# Patient Record
Sex: Female | Born: 2005 | Race: White | Hispanic: No | Marital: Single | Smoking: Never smoker
Health system: Southern US, Community
[De-identification: ages and names within clinical notes are randomized; demographics above are authoritative.]

## PROBLEM LIST (undated history)

## (undated) DIAGNOSIS — F845 Asperger's syndrome: Secondary | ICD-10-CM

## (undated) HISTORY — PX: WISDOM TOOTH EXTRACTION: SHX21

## (undated) HISTORY — PX: TONSILLECTOMY: SUR1361

---
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
## 2024-06-15 ENCOUNTER — Ambulatory Visit: Payer: Self-pay | Admitting: Family Medicine

## 2024-06-15 ENCOUNTER — Encounter: Payer: Self-pay | Admitting: Emergency Medicine

## 2024-06-15 ENCOUNTER — Ambulatory Visit
Admission: EM | Admit: 2024-06-15 | Discharge: 2024-06-15 | Disposition: A | Discharge status: Home / Self Care | Payer: Self-pay | Attending: Family Medicine | Admitting: Family Medicine

## 2024-06-15 DIAGNOSIS — J069 Acute upper respiratory infection, unspecified: Secondary | ICD-10-CM | POA: Insufficient documentation

## 2024-06-15 DIAGNOSIS — R1012 Left upper quadrant pain: Secondary | ICD-10-CM | POA: Insufficient documentation

## 2024-06-15 LAB — MONONUCLEOSIS SCREEN: Mono Screen: NEGATIVE

## 2024-06-15 LAB — URINALYSIS, W/ REFLEX TO CULTURE (INFECTION SUSPECTED)
Bilirubin Urine: NEGATIVE
Glucose, UA: NEGATIVE mg/dL
Hgb urine dipstick: NEGATIVE
Ketones, ur: NEGATIVE mg/dL
Leukocytes,Ua: NEGATIVE
Nitrite: NEGATIVE
Protein, ur: NEGATIVE mg/dL
RBC / HPF: NONE SEEN RBC/hpf (ref 0–5)
Specific Gravity, Urine: 1.015 (ref 1.005–1.030)
WBC, UA: NONE SEEN WBC/hpf (ref 0–5)
pH: 7 (ref 5.0–8.0)

## 2024-06-15 LAB — GROUP A STREP BY PCR: Group A Strep by PCR: NOT DETECTED

## 2024-06-15 LAB — RESP PANEL BY RT-PCR (FLU A&B, COVID) ARPGX2
Influenza A by PCR: NEGATIVE
Influenza B by PCR: NEGATIVE
SARS Coronavirus 2 by RT PCR: NEGATIVE

## 2024-06-15 MED ORDER — PROMETHAZINE-DM 6.25-15 MG/5ML PO SYRP: 5.0000 mL | ORAL_SOLUTION | Freq: Four times a day (QID) | ORAL | 0 refills | Status: AC | PRN

## 2024-06-15 MED ORDER — IPRATROPIUM BROMIDE 0.06 % NA SOLN: 2.0000 | Freq: Four times a day (QID) | NASAL | 12 refills | Status: AC

## 2024-06-15 NOTE — ED Triage Notes (Signed)
 Patient c/o sore throat and bodyaches that started yesterday.  Patient also reports left sided lower abdominal pain and dysuria that started 3 days ago.  Patient denies vaginal discharge.  Patient unsure of fevers.

## 2024-06-15 NOTE — ED Provider Notes (Signed)
 [facility]    CSN: [id number] Arrival date & time: 06/15/24  1316      History   Chief Complaint Chief Complaint  Patient presents with   Abdominal Pain   Dysuria   Sore Throat    HPI Belvia A Amster is a 18 y.o. female.   HPI  History obtained from the patient. Juan presents for sore throat, cough, nasal congestion and low grade fevers that started yesterday.  Ear pain this morning. Has bilateral watery ear discharge with sharp pain.  Put some ear drops in her ears. She has not been swimming recently.  Denies vomiting and diarrhea. She has left sided abdominal pain that started 3 days ago. No new back pain. Says she is achy all over.  Denies dysuria, vaginal discharge, hematuria, urinary frequency. Requests urine testing as she believes she may have a urinary tract infection. No concern for STI. She states she has severe anxiety.       Past Medical History:  Diagnosis Date   Asperger's disorder     There are no active problems to display for this patient.   Past Surgical History:  Procedure Laterality Date   TONSILLECTOMY     WISDOM TOOTH EXTRACTION      OB History   No obstetric history on file.      Home Medications    Prior to Admission medications   Medication Sig Start Date End Date Taking? Authorizing Provider  ipratropium (ATROVENT ) 0.06 % nasal spray Place 2 sprays into both nostrils 4 (four) times daily. 06/15/24  Yes Mashell Sieben, DO  promethazine -dextromethorphan (PROMETHAZINE -DM) 6.25-15 MG/5ML syrup Take 5 mLs by mouth 4 (four) times daily as needed. 06/15/24  Yes Janna Oak, DO  propranolol (INDERAL) 10 MG tablet Take 10 mg by mouth. 03/01/24  Yes [provider]  sertraline (ZOLOFT) 100 MG tablet Take 100 mg by mouth. 03/31/24  Yes [provider]    Family History History reviewed. No pertinent family history.  Social History Social History   Tobacco Use   Smoking status: Never   Smokeless  tobacco: Never  Vaping Use   Vaping status: Former  Substance Use Topics   Alcohol use: No   Drug use: No     Allergies   Patient has no known allergies.   Review of Systems Review of Systems: negative unless otherwise stated in HPI.      Physical Exam Triage Vital Signs ED Triage Vitals  Encounter Vitals Group     BP 06/15/24 1330 125/84     Girls Systolic BP Percentile --      Girls Diastolic BP Percentile --      Boys Systolic BP Percentile --      Boys Diastolic BP Percentile --      Pulse Rate 06/15/24 1330 90     Resp 06/15/24 1330 14     Temp 06/15/24 1330 99.1 F (37.3 C)     Temp Source 06/15/24 1330 Oral     SpO2 06/15/24 1330 97 %     Weight 06/15/24 1328 135 lb (61.2 kg)     Height 06/15/24 1328 5' 3.5 (1.613 m)     Head Circumference --      Peak Flow --      Pain Score 06/15/24 1328 8     Pain Loc --      Pain Education --      Exclude from Growth Chart --    No data found.  Updated  Vital Signs BP 125/84 (BP Location: Right Arm)   Pulse 90   Temp 99.1 F (37.3 C) (Oral)   Resp 14   Ht 5' 3.5 (1.613 m)   Wt 61.2 kg   LMP 05/31/2024 (Exact Date)   SpO2 97%   BMI 23.54 kg/m   Visual Acuity Right Eye Distance:   Left Eye Distance:   Bilateral Distance:    Right Eye Near:   Left Eye Near:    Bilateral Near:     Physical Exam GEN:     alert, non-toxic appearing female in no distress    HENT:  mucus membranes moist, oropharyngeal without lesions, mild erythema, no tonsillar hypertrophy or exudates,  moderate erythematous edematous turbinates, clear nasal discharge, bilateral TM normal EYES:   pupils equal and reactive, no scleral injection or discharge NECK:  normal ROM, + bilateral anterior and posterior lymphadenopathy, no meningismus   RESP:  no increased work of breathing, clear to auscultation bilaterally CVS:   regular rate and rhythm ABD:   Soft, nontender, nondistended, no guarding, no rebound, active bowel sounds throughout,  negative McBurney's, negative Murphy's Skin:   warm and dry, no rash on visible skin    UC Treatments / Results  Labs (all labs ordered are listed, but only abnormal results are displayed) Labs Reviewed  URINALYSIS, W/ REFLEX TO CULTURE (INFECTION SUSPECTED) - Abnormal; Notable for the following components:      Result Value   Bacteria, UA RARE (*)    All other components within normal limits  GROUP A STREP BY PCR  RESP PANEL BY RT-PCR (FLU A&B, COVID) [id number]  MONONUCLEOSIS SCREEN    EKG   Radiology No results found.   Procedures Procedures (including critical care time)  Medications Ordered in UC Medications - No data to display  Initial Impression / Assessment and Plan / UC Course  I have reviewed the triage vital signs and the nursing notes.  Pertinent labs & imaging results that were available during my care of the patient were reviewed by me and considered in my medical decision making (see chart for details).       Pt is a 18 y.o. female who presents for 1 days of respiratory symptoms. Levana is afebrile here without recent antipyretics. Satting well on room air. Overall pt is non-toxic appearing, well hydrated, without respiratory distress. Pulmonary exam is unremarkable.  COVID and influenza panel obtained and was negative. Strep PCR is negative.  She has no evidence of otitis media or externa. There was no ceruminosis or effusion either. Mononucleosis test offered and obtained. I will call patient if result is positive.   Suspect acute viral respiratory illness. Discussed symptomatic treatment.  Explained lack of efficacy of antibiotics in viral disease.  Typical duration of symptoms discussed.  Atrovent  nasal spray for nasal congestion and promethazine  DM for cough.    Regarding her abdominal pain, abdominal exam is normal.  Urinalysis is unremarkable.    Return and ED precautions given and voiced understanding. Discussed MDM, treatment plan and plan for  follow-up with patient who agrees with plan.    Mononucleosis screen is negative.    Final Clinical Impressions(s) / UC Diagnoses   Final diagnoses:  Viral URI with cough  Left upper quadrant abdominal pain     Discharge Instructions      You don't have a urinary tract infection. Your strep, influenza and COVID are negative. I will call you if your mono test is positive. You will see your  results in your MyChart (if you have an active [facility] MyChart).  If negative, you have a viral respiratory infection that will gradually improve over the next 7-10 days. Cough may last up to 3 weeks.    You can take Tylenol and/or Ibuprofen as needed for fever reduction and pain relief.    For cough: honey 1/2 to 1 teaspoon (you can dilute the honey in water or another fluid).  Stop at the pharmacy to pick up your prescription cough medication. You can use a humidifier for chest congestion and cough.  If you don't have a humidifier, you can sit in the bathroom with the hot shower running.      For sore throat: try warm salt water gargles, Mucinex sore throat cough drops or cepacol lozenges, throat spray, warm tea or water with lemon/honey, popsicles or ice, or OTC cold relief medicine for throat discomfort. You can also purchase chloraseptic spray at the pharmacy or dollar store.    For congestion: take a daily anti-histamine like Zyrtec, Claritin, and pick up the prescription nasal spray from the pharmacy.    It is important to stay hydrated: drink plenty of fluids (water, gatorade/powerade/pedialyte, juices, or teas) to keep your throat moisturized and help further relieve irritation/discomfort.    Return or go to the Emergency Department if symptoms worsen or do not improve in the next few days      ED Prescriptions     Medication Sig Dispense Auth. Provider   ipratropium (ATROVENT ) 0.06 % nasal spray Place 2 sprays into both nostrils 4 (four) times daily. 15 mL Johnnisha Forton, DO    promethazine -dextromethorphan (PROMETHAZINE -DM) 6.25-15 MG/5ML syrup Take 5 mLs by mouth 4 (four) times daily as needed. 118 mL Masyn Fullam, DO      PDMP not reviewed this encounter.   Yigit Norkus, DO 06/15/24 1448

## 2024-06-15 NOTE — Discharge Instructions (Addendum)
 You don't have a urinary tract infection. Your strep, influenza and COVID are negative. I will call you if your mono test is positive. You will see your results in your MyChart (if you have an active [place] MyChart).  If negative, you have a viral respiratory infection that will gradually improve over the next 7-10 days. Cough may last up to 3 weeks.    You can take Tylenol and/or Ibuprofen as needed for fever reduction and pain relief.    For cough: honey 1/2 to 1 teaspoon (you can dilute the honey in water or another fluid).  Stop at the pharmacy to pick up your prescription cough medication. You can use a humidifier for chest congestion and cough.  If you don't have a humidifier, you can sit in the bathroom with the hot shower running.      For sore throat: try warm salt water gargles, Mucinex sore throat cough drops or cepacol lozenges, throat spray, warm tea or water with lemon/honey, popsicles or ice, or OTC cold relief medicine for throat discomfort. You can also purchase chloraseptic spray at the pharmacy or dollar store.    For congestion: take a daily anti-histamine like Zyrtec, Claritin, and pick up the prescription nasal spray from the pharmacy.    It is important to stay hydrated: drink plenty of fluids (water, gatorade/powerade/pedialyte, juices, or teas) to keep your throat moisturized and help further relieve irritation/discomfort.    Return or go to the Emergency Department if symptoms worsen or do not improve in the next few days
